# Patient Record
Sex: Female | Born: 1950 | Race: White | Hispanic: No
Health system: Southern US, Community
[De-identification: ages and names within clinical notes are randomized; demographics above are authoritative.]

---

## 2008-03-25 ENCOUNTER — Ambulatory Visit: Payer: Self-pay | Admitting: Gastroenterology

## 2008-04-01 ENCOUNTER — Telehealth: Payer: Self-pay | Admitting: Gastroenterology

## 2008-04-08 ENCOUNTER — Ambulatory Visit: Payer: Self-pay | Admitting: Gastroenterology

## 2014-06-08 ENCOUNTER — Other Ambulatory Visit: Payer: Self-pay | Admitting: Orthopaedic Surgery

## 2014-06-08 DIAGNOSIS — M25511 Pain in right shoulder: Secondary | ICD-10-CM

## 2014-06-18 ENCOUNTER — Ambulatory Visit
Admission: RE | Admit: 2014-06-18 | Discharge: 2014-06-18 | Disposition: A | Discharge status: Home / Self Care | Payer: Commercial Managed Care - HMO | Source: Ambulatory Visit | Attending: Orthopaedic Surgery | Admitting: Orthopaedic Surgery

## 2014-06-18 DIAGNOSIS — M25511 Pain in right shoulder: Secondary | ICD-10-CM

## 2014-12-18 ENCOUNTER — Encounter: Payer: Self-pay | Admitting: Gastroenterology

## 2017-02-14 ENCOUNTER — Other Ambulatory Visit: Payer: Self-pay | Admitting: Orthopaedic Surgery

## 2017-02-14 DIAGNOSIS — M542 Cervicalgia: Secondary | ICD-10-CM

## 2017-03-05 ENCOUNTER — Ambulatory Visit
Admission: RE | Admit: 2017-03-05 | Discharge: 2017-03-05 | Disposition: A | Discharge status: Home / Self Care | Payer: Commercial Managed Care - HMO | Source: Ambulatory Visit | Attending: Orthopaedic Surgery | Admitting: Orthopaedic Surgery

## 2017-03-05 DIAGNOSIS — M542 Cervicalgia: Secondary | ICD-10-CM

## 2018-09-27 IMAGING — MR MR CERVICAL SPINE W/O CM
5 series · 33 of 48 positions shown · non-contrast
Comparison: Radiographs dated 05/15/2012

CLINICAL DATA: Cervicalgia.

EXAM:
MRI CERVICAL SPINE WITHOUT CONTRAST
TECHNIQUE: Multiplanar, multisequence MR imaging of the cervical spine was
performed. No intravenous contrast was administered.

[Series 6: T1 · sagittal · 3.3mm · 0.56mm/px · 6 of 13 slices shown]
[im 1/13]
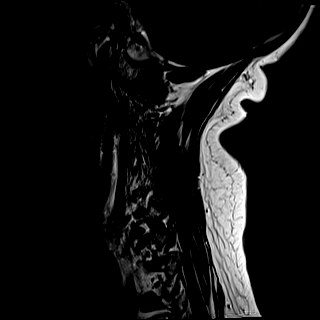
[im 3/13]
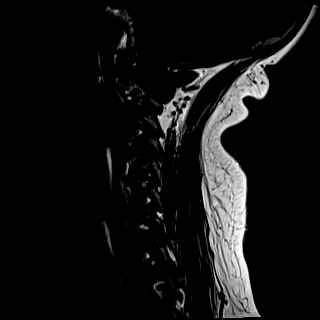
[im 5/13]
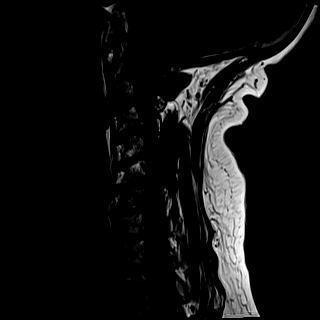
[im 8/13]
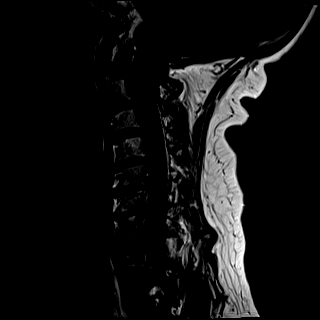
[im 10/13]
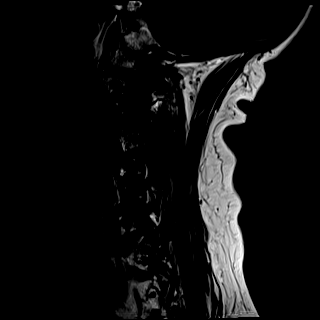
[im 13/13]
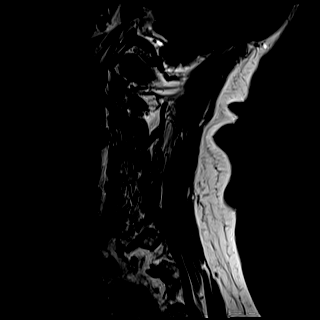

[Series 7: T2 · sagittal · 3.3mm · 0.56mm/px · 7 of 13 slices shown (1 of 2)]
[im 1/13]
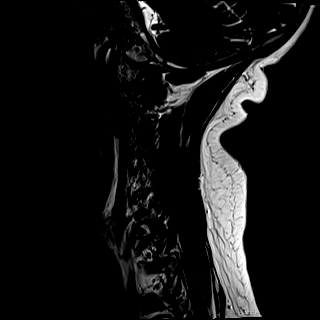
[im 3/13]
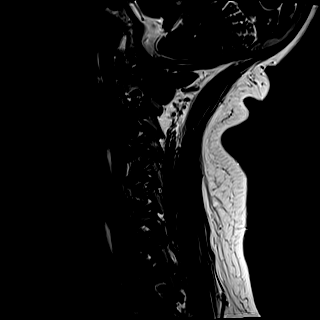
[im 5/13]
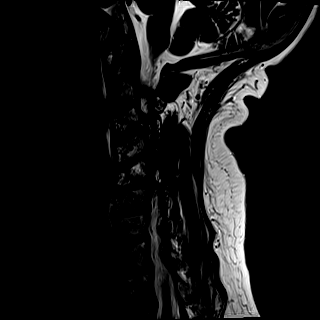
[im 7/13]
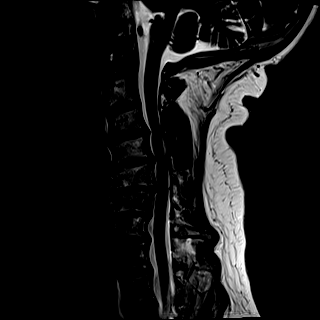
[im 9/13]
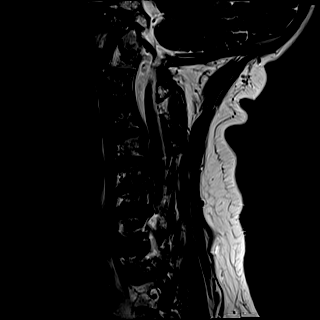
[im 11/13]
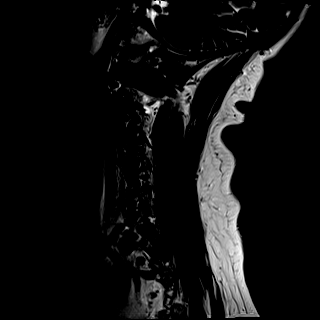
[im 13/13]
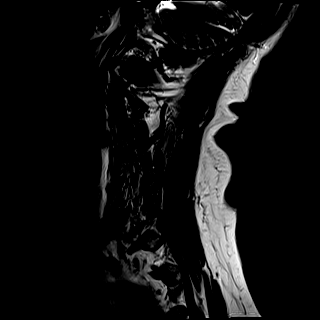

[Series 8: STIR · sagittal · 3.3mm · 0.35mm/px · 7 of 13 slices shown]
[im 1/13]
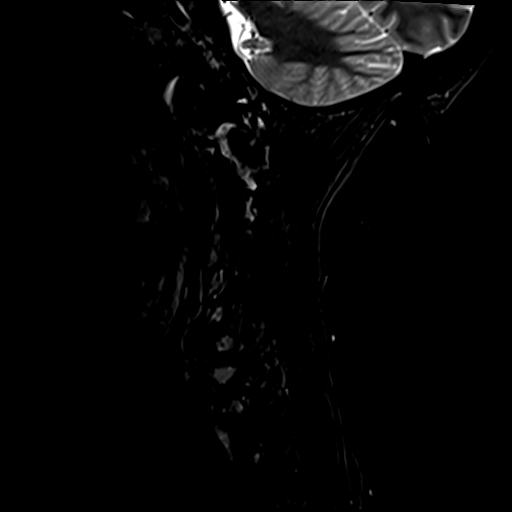
[im 3/13]
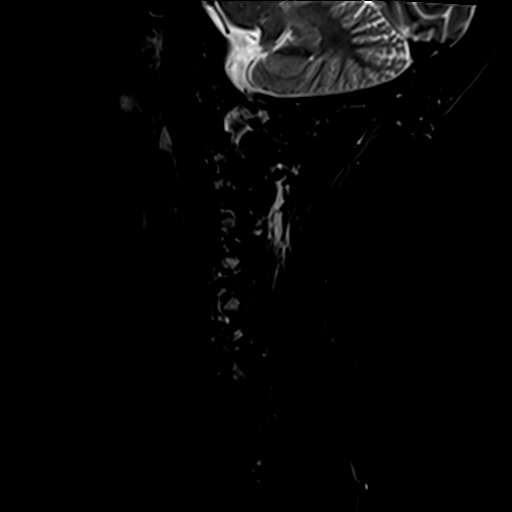
[im 5/13]
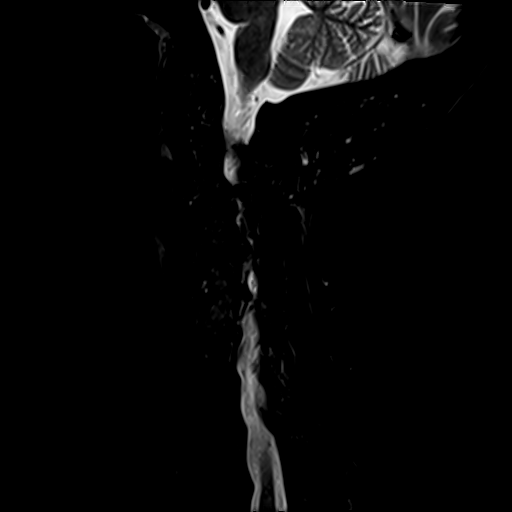
[im 7/13]
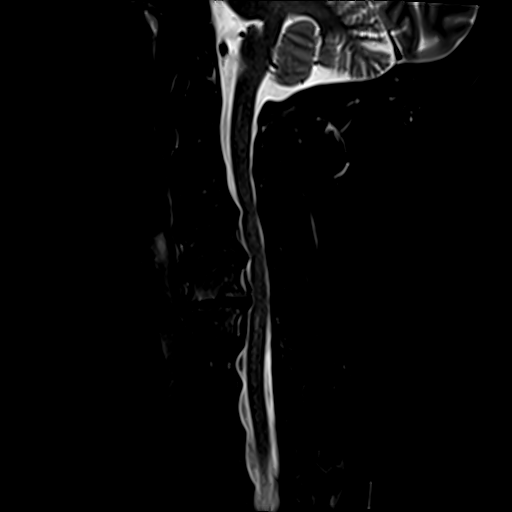
[im 9/13]
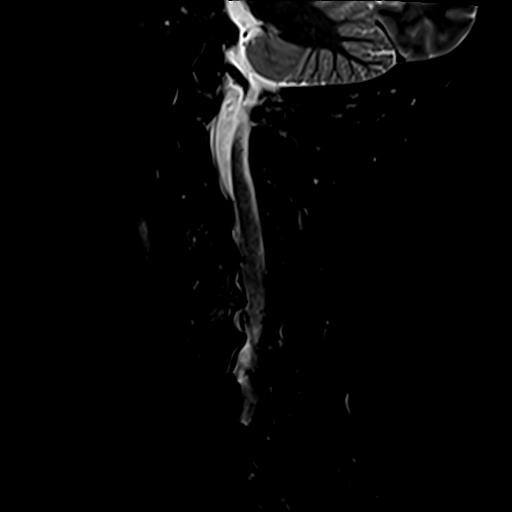
[im 11/13]
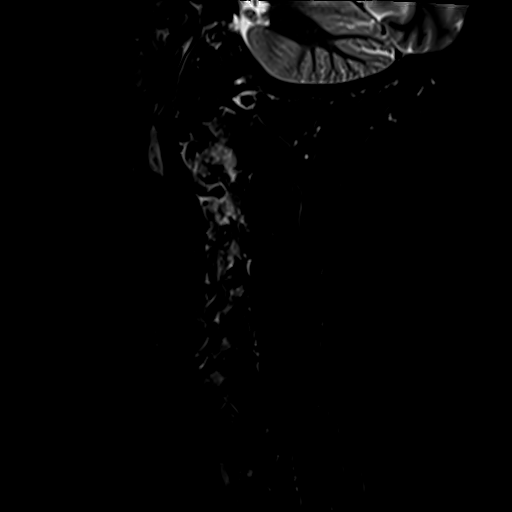
[im 13/13]
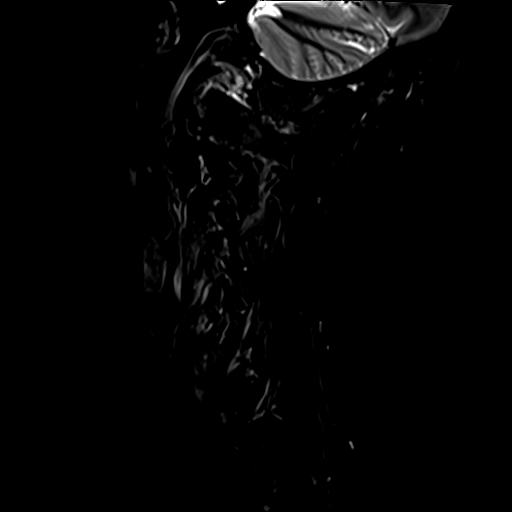

[Series 9: T2 · axial · 3.0mm · 0.50mm/px · z∈[-70,+12]mm · 8 of 26 slices shown (2 of 2)]
[im 1/26]
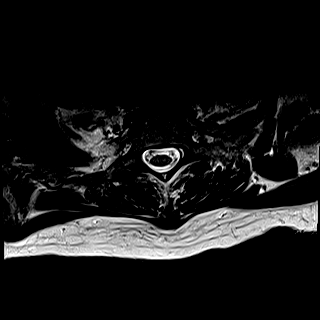
[im 4/26]
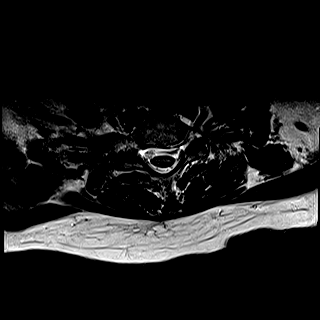
[im 8/26]
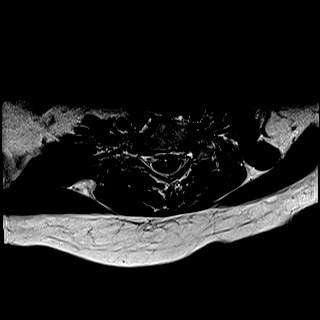
[im 12/26]
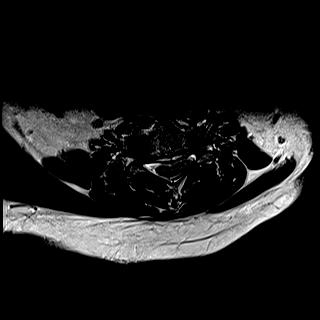
[im 14/26]
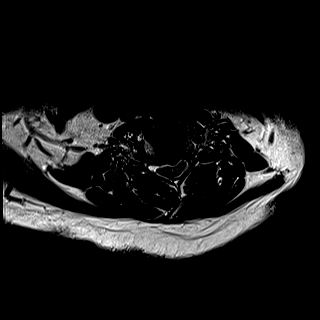
[im 18/26]
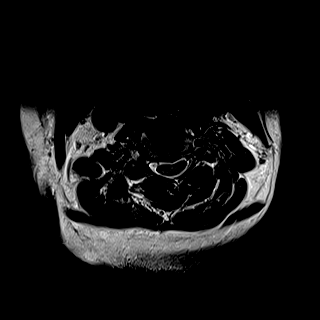
[im 22/26]
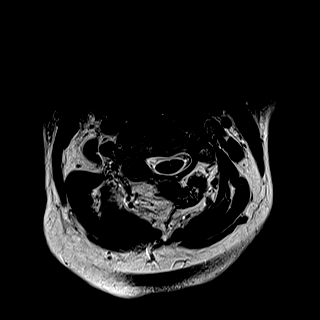
[im 26/26]
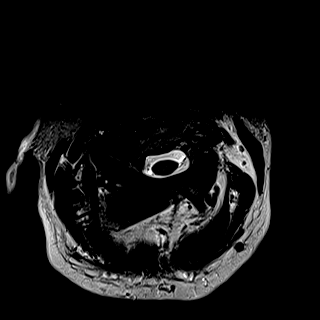

[Series 10: GRE · axial · 3.0mm · 0.42mm/px · z∈[-70,-27]mm · 5 of 26 slices shown]
[im 1/26]
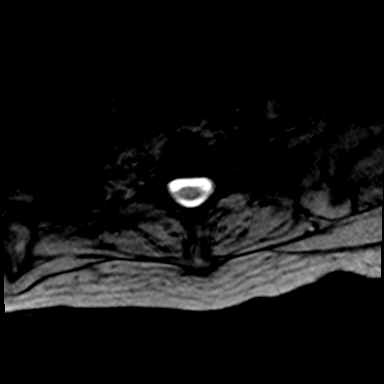
[im 4/26]
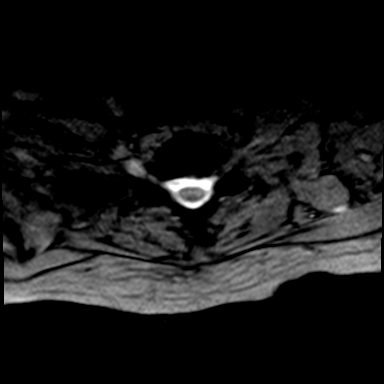
[im 8/26]
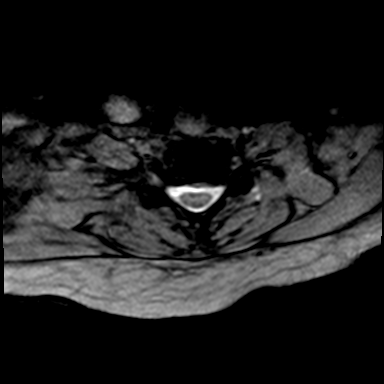
[im 12/26]
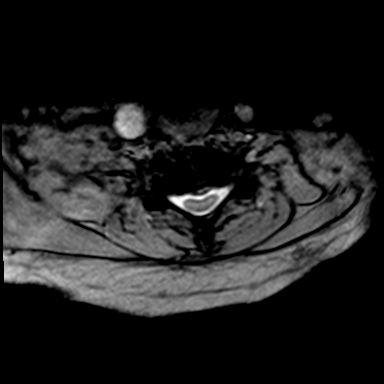
[im 14/26]
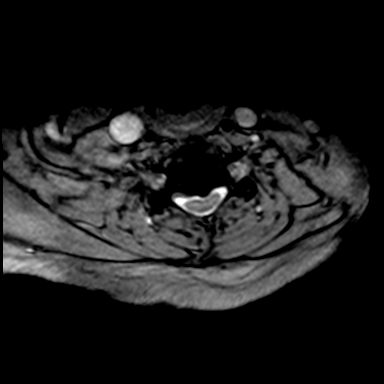

[33 of 48 positions shown; findings below may reference images not displayed]

FINDINGS: Alignment: Straightening of the cervical lordosis.

Vertebrae: No fracture, evidence of discitis, or bone lesion.

Cord: There is no myelopathy or mass lesion. The spinal cord is
slightly compressed at several levels as described below.

Posterior Fossa, vertebral arteries, paraspinal tissues: Negative.

Disc levels:

Craniocervical junction through C2-3:  Negative.

C3-4: Small broad-based disc osteophyte complex asymmetric to the
right with slight right facet arthritis. No severe foraminal
stenosis.

C4-5: Disc space narrowing. Broad-based disc osteophyte complex
asymmetric to the right with severe right foraminal stenosis and
right lateral recess stenosis. Central compression of the thecal sac
and spinal cord without myelopathy.

C5-6: Disc space narrowing. Broad-based disc osteophyte complex
asymmetric to the right with severe right foraminal stenosis and
bilateral lateral recess impingement. Central osteophyte indents the
ventral aspect of the spinal cord without myelopathy.

C6-7: Broad-based disc osteophyte complex asymmetric to the left
with severe left foraminal stenosis and left lateral recess
impingement.

C7-T1: Tiny central disc bulge with no neural impingement. Widely
patent neural foramina. Minimal degenerative changes of the facet
joints.
IMPRESSION: 1. Severe right foraminal stenosis at C4-5 and C5-6.
2. Severe left foraminal stenosis at C6-7.
3. Cervical spinal stenosis at several levels with slight
compression of the cervical spinal cord without myelopathy. This is
most prominent at C4-5 and C5-6.

## 2019-09-14 ENCOUNTER — Ambulatory Visit: Payer: Commercial Managed Care - HMO | Attending: Internal Medicine

## 2019-09-14 DIAGNOSIS — Z23 Encounter for immunization: Secondary | ICD-10-CM | POA: Insufficient documentation

## 2019-09-14 NOTE — Progress Notes (Signed)
   Covid-[facility] Vaccination [facility]  Name:  Sya Nestler    MRN: [id number] DOB: 1950/12/30  09/14/2019  Ms. Mcclurkin was observed post Covid-19 immunization for 15 minutes without incidence. She was provided with Vaccine Information Sheet and instruction to access the V-Safe system.   Ms. Rule was instructed to call 911 with any severe reactions post vaccine: Marland Kitchen Difficulty breathing  . Swelling of your face and throat  . A fast heartbeat  . A bad rash all over your body  . Dizziness and weakness    Immunizations Administered    Name Date Dose VIS Date Route   Pfizer COVID-19 Vaccine 09/14/2019  1:24 PM 0.3 mL 07/11/2019 Intramuscular   Manufacturer: [employer], [employer]   Lot: [id number]   NDC: [address]-5

## 2019-10-07 ENCOUNTER — Ambulatory Visit: Payer: Commercial Managed Care - HMO | Attending: Internal Medicine

## 2019-10-07 DIAGNOSIS — Z23 Encounter for immunization: Secondary | ICD-10-CM

## 2019-10-07 NOTE — Progress Notes (Signed)
   Covid-19 Vaccination Clinic  Name:  Jill Stephens    MRN: [id number] DOB: 28-Jun-1951  10/07/2019  Jill Stephens was observed post Covid-19 immunization for 30 minutes based on pre-vaccination screening without incident. She was provided with Vaccine Information Sheet and instruction to access the V-Safe system.   Jill Stephens was instructed to call 911 with any severe reactions post vaccine: Marland Kitchen Difficulty breathing  . Swelling of face and throat  . A fast heartbeat  . A bad rash all over body  . Dizziness and weakness   Immunizations Administered    Name Date Dose VIS Date Route   Pfizer COVID-19 Vaccine 10/07/2019  8:16 AM 0.3 mL 07/11/2019 Intramuscular   Manufacturer: [employer], [employer]   Lot: [id number]   NDC: [address]-4

## 2019-10-08 ENCOUNTER — Ambulatory Visit: Payer: Commercial Managed Care - HMO

## 2022-05-12 ENCOUNTER — Other Ambulatory Visit: Payer: Self-pay | Admitting: Rehabilitation

## 2022-05-12 DIAGNOSIS — M47812 Spondylosis without myelopathy or radiculopathy, cervical region: Secondary | ICD-10-CM

## 2022-06-03 ENCOUNTER — Ambulatory Visit
Admission: RE | Admit: 2022-06-03 | Discharge: 2022-06-03 | Disposition: A | Discharge status: Home / Self Care | Payer: Medicare HMO | Source: Ambulatory Visit | Attending: Rehabilitation | Admitting: Rehabilitation

## 2022-06-03 DIAGNOSIS — M47812 Spondylosis without myelopathy or radiculopathy, cervical region: Secondary | ICD-10-CM

## 2022-06-03 MED ORDER — GADOPICLENOL 0.5 MMOL/ML IV SOLN
6.0000 mL | Freq: Once | INTRAVENOUS | Status: AC | PRN
  Administered 2022-06-03: 6 mL via INTRAVENOUS

## 2023-06-27 ENCOUNTER — Other Ambulatory Visit: Payer: Self-pay | Admitting: Family Medicine

## 2023-06-27 DIAGNOSIS — M4802 Spinal stenosis, cervical region: Secondary | ICD-10-CM

## 2023-07-23 ENCOUNTER — Encounter: Payer: Self-pay | Admitting: Family Medicine

## 2023-07-30 ENCOUNTER — Ambulatory Visit
Admission: RE | Admit: 2023-07-30 | Discharge: 2023-07-30 | Disposition: A | Discharge status: Home / Self Care | Payer: Medicare HMO | Source: Ambulatory Visit | Attending: Family Medicine | Admitting: Family Medicine

## 2023-07-30 DIAGNOSIS — M4802 Spinal stenosis, cervical region: Secondary | ICD-10-CM

## 2023-08-30 ENCOUNTER — Other Ambulatory Visit: Payer: Self-pay | Admitting: Orthopaedic Surgery

## 2023-08-30 DIAGNOSIS — M4322 Fusion of spine, cervical region: Secondary | ICD-10-CM

## 2023-08-30 DIAGNOSIS — M47894 Other spondylosis, thoracic region: Secondary | ICD-10-CM

## 2023-09-11 ENCOUNTER — Encounter: Payer: Self-pay | Admitting: Orthopaedic Surgery

## 2023-09-14 ENCOUNTER — Ambulatory Visit
Admission: RE | Admit: 2023-09-14 | Discharge: 2023-09-14 | Disposition: A | Discharge status: Home / Self Care | Payer: Medicare HMO | Source: Ambulatory Visit | Attending: Orthopaedic Surgery | Admitting: Orthopaedic Surgery

## 2023-09-14 DIAGNOSIS — M4322 Fusion of spine, cervical region: Secondary | ICD-10-CM

## 2023-09-14 DIAGNOSIS — M47894 Other spondylosis, thoracic region: Secondary | ICD-10-CM
